# Patient Record
Sex: Male | Born: 2000 | Race: Black or African American | Hispanic: No | Marital: Single | Smoking: Never smoker
Health system: Southern US, Community
[De-identification: ages and names within clinical notes are randomized; demographics above are authoritative.]

## PROBLEM LIST (undated history)

## (undated) DIAGNOSIS — J45909 Unspecified asthma, uncomplicated: Secondary | ICD-10-CM

---

## 2021-07-21 ENCOUNTER — Ambulatory Visit (INDEPENDENT_AMBULATORY_CARE_PROVIDER_SITE_OTHER): Payer: Federal, State, Local not specified - PPO

## 2021-07-21 ENCOUNTER — Other Ambulatory Visit: Payer: Self-pay

## 2021-07-21 ENCOUNTER — Encounter (HOSPITAL_COMMUNITY): Payer: Self-pay | Admitting: *Deleted

## 2021-07-21 ENCOUNTER — Ambulatory Visit (HOSPITAL_COMMUNITY)
Admission: EM | Admit: 2021-07-21 | Discharge: 2021-07-21 | Disposition: A | Discharge status: Home / Self Care | Payer: Federal, State, Local not specified - PPO | Attending: Medical Oncology | Admitting: Medical Oncology

## 2021-07-21 DIAGNOSIS — R0602 Shortness of breath: Secondary | ICD-10-CM

## 2021-07-21 DIAGNOSIS — Z20822 Contact with and (suspected) exposure to covid-19: Secondary | ICD-10-CM | POA: Diagnosis not present

## 2021-07-21 MED ORDER — ALBUTEROL SULFATE HFA 108 (90 BASE) MCG/ACT IN AERS: 1.0000 | INHALATION_SPRAY | Freq: Four times a day (QID) | RESPIRATORY_TRACT | 0 refills | Status: AC | PRN

## 2021-07-21 NOTE — ED Triage Notes (Signed)
Pt reports he felt SHOB earlier.

## 2021-07-21 NOTE — ED Provider Notes (Signed)
[facility]    CSN: [id number] Arrival date & time: 07/21/21  1842      History   Chief Complaint SOB   HPI Jimmy Thornton is a 20 y.o. male.   HPI  SOB: Patient states that over the past few days he has noted some very quick intermittent episodes of shortness of breath.  He states that they occur at random and lasts just a few seconds and resolved.  They are not limited to exercise and do not seem to worsen with exercise.  Last episode started when he raised his left hand above his head.  He does have a history of asthma when he was a child.  He tried an old inhaler for symptoms which did help.  He denies any fevers, significant cough, wheezing, chest pain.  No known sick contacts.  History reviewed. No pertinent past medical history.  There are no problems to display for this patient.   History reviewed. No pertinent surgical history.     Home Medications    Prior to Admission medications   Medication Sig Start Date End Date Taking? Authorizing Provider  albuterol (VENTOLIN HFA) 108 (90 Base) MCG/ACT inhaler Inhale 1-2 puffs into the lungs every 6 (six) hours as needed for wheezing or shortness of breath. 07/21/21  Yes Rushie Chestnut, PA-C    Family History History reviewed. No pertinent family history.  Social History Social History   Tobacco Use   Smoking status: Never   Smokeless tobacco: Never     Allergies   Patient has no known allergies.   Review of Systems Review of Systems  As stated above in HPI Physical Exam Triage Vital Signs ED Triage Vitals  Enc Vitals Group     BP 07/21/21 1853 (!) 147/75     Pulse Rate 07/21/21 1853 79     Resp 07/21/21 1853 18     Temp 07/21/21 1853 98.7 F (37.1 C)     Temp src --      SpO2 07/21/21 1853 100 %     Weight --      Height --      Head Circumference --      Peak Flow --      Pain Score 07/21/21 1855 0     Pain Loc --      Pain Edu? --      Excl. in GC? --    No data  found.  Updated Vital Signs BP (!) 147/75   Pulse 79   Temp 98.7 F (37.1 C)   Resp 18   SpO2 100%   Physical Exam Vitals and nursing note reviewed.  Constitutional:      General: He is not in acute distress.    Appearance: Normal appearance. He is normal weight. He is not ill-appearing, toxic-appearing or diaphoretic.  HENT:     Head: Normocephalic and atraumatic.     Nose: Congestion (scant) present. No rhinorrhea.     Mouth/Throat:     Mouth: Mucous membranes are moist.     Pharynx: Oropharynx is clear. No oropharyngeal exudate or posterior oropharyngeal erythema.  Eyes:     Extraocular Movements: Extraocular movements intact.     Pupils: Pupils are equal, round, and reactive to light.     Comments: No pallor  Cardiovascular:     Rate and Rhythm: Normal rate and regular rhythm.     Pulses: Normal pulses.     Heart sounds: Normal heart sounds.  Pulmonary:  Effort: Pulmonary effort is normal. No respiratory distress.     Breath sounds: Normal breath sounds. No stridor. No wheezing, rhonchi or rales.  Chest:     Chest wall: No tenderness.  Lymphadenopathy:     Cervical: No cervical adenopathy.  Skin:    General: Skin is warm.     Coloration: Skin is not jaundiced or pale.     Comments: No evidence of cyanosis  Neurological:     Mental Status: He is alert and oriented to person, place, and time.     UC Treatments / Results  Labs (all labs ordered are listed, but only abnormal results are displayed) Labs Reviewed  SARS CORONAVIRUS 2 (TAT 6-24 HRS)    EKG   Radiology No results found.  Procedures Procedures (including critical care time)  Medications Ordered in UC Medications - No data to display  Initial Impression / Assessment and Plan / UC Course  I have reviewed the triage vital signs and the nursing notes.  Pertinent labs & imaging results that were available during my care of the patient were reviewed by me and considered in my medical decision  making (see chart for details).     New.  Vitals and physical exam are very reassuring.  COVID test pending, chest x-ray pending.  We will send in albuterol for him to use as needed.  He will need to follow-up with his PCP.  Discussed red flag signs and symptoms. Final Clinical Impressions(s) / UC Diagnoses   Final diagnoses:  SOB (shortness of breath)   Discharge Instructions   None    ED Prescriptions     Medication Sig Dispense Auth. Provider   albuterol (VENTOLIN HFA) 108 (90 Base) MCG/ACT inhaler Inhale 1-2 puffs into the lungs every 6 (six) hours as needed for wheezing or shortness of breath. 18 each Rushie Chestnut, PA-C      PDMP not reviewed this encounter.   Rushie Chestnut, Cordelia Poche 07/21/21 2029

## 2021-07-22 ENCOUNTER — Emergency Department (HOSPITAL_COMMUNITY)
Admission: EM | Admit: 2021-07-22 | Discharge: 2021-07-22 | Disposition: A | Discharge status: Home / Self Care | Payer: Federal, State, Local not specified - PPO | Attending: Emergency Medicine | Admitting: Emergency Medicine

## 2021-07-22 ENCOUNTER — Encounter (HOSPITAL_COMMUNITY): Payer: Self-pay | Admitting: Emergency Medicine

## 2021-07-22 ENCOUNTER — Emergency Department (HOSPITAL_COMMUNITY): Payer: Federal, State, Local not specified - PPO

## 2021-07-22 DIAGNOSIS — R55 Syncope and collapse: Secondary | ICD-10-CM | POA: Diagnosis not present

## 2021-07-22 DIAGNOSIS — R42 Dizziness and giddiness: Secondary | ICD-10-CM | POA: Diagnosis not present

## 2021-07-22 DIAGNOSIS — R0602 Shortness of breath: Secondary | ICD-10-CM | POA: Diagnosis not present

## 2021-07-22 DIAGNOSIS — J45909 Unspecified asthma, uncomplicated: Secondary | ICD-10-CM | POA: Diagnosis not present

## 2021-07-22 HISTORY — DX: Unspecified asthma, uncomplicated: J45.909

## 2021-07-22 LAB — SARS CORONAVIRUS 2 (TAT 6-24 HRS): SARS Coronavirus 2: NEGATIVE

## 2021-07-22 LAB — BASIC METABOLIC PANEL
Anion gap: 7 (ref 5–15)
BUN: 16 mg/dL (ref 6–20)
CO2: 28 mmol/L (ref 22–32)
Calcium: 9.3 mg/dL (ref 8.9–10.3)
Chloride: 101 mmol/L (ref 98–111)
Creatinine, Ser: 1.02 mg/dL (ref 0.61–1.24)
GFR, Estimated: >60 mL/min (ref >60)
Glucose, Bld: 85 mg/dL (ref 70–99)
Potassium: 3.9 mmol/L (ref 3.5–5.1)
Sodium: 136 mmol/L (ref 135–145)

## 2021-07-22 LAB — CBC
HCT: 42.5 % (ref 39.0–52.0)
Hemoglobin: 14.4 g/dL (ref 13.0–17.0)
MCH: 31.3 pg (ref 26.0–34.0)
MCHC: 33.9 g/dL (ref 30.0–36.0)
MCV: 92.4 fL (ref 80.0–100.0)
Platelets: 267 10*3/uL (ref 150–400)
RBC: 4.6 MIL/uL (ref 4.22–5.81)
RDW: 11 % — ABNORMAL LOW (ref 11.5–15.5)
WBC: 5.7 10*3/uL (ref 4.0–10.5)
nRBC: 0 % (ref 0.0–0.2)

## 2021-07-22 LAB — TROPONIN I (HIGH SENSITIVITY)
Troponin I (High Sensitivity): <2 ng/L (ref <18)
Troponin I (High Sensitivity): 3 ng/L (ref <18)

## 2021-07-22 LAB — D-DIMER, QUANTITATIVE: D-Dimer, Quant: <0.27 ug/mL-FEU (ref 0.00–0.50)

## 2021-07-22 MED ORDER — SODIUM CHLORIDE 0.9 % IV BOLUS
1000.0000 mL | Freq: Once | INTRAVENOUS | Status: AC
Start: 2021-07-22 — End: 2021-07-22
  Administered 2021-07-22: 1000 mL via INTRAVENOUS

## 2021-07-22 NOTE — ED Triage Notes (Signed)
Pt reports syncopal episode while watching his brother at [employer].  States someone caught him.  Denies dizziness piror to or after event.  States he was taking Vitamin D3 but stopped approx 1 week ago because of the way he made him feel.

## 2021-07-22 NOTE — ED Provider Notes (Signed)
[facility] EMERGENCY DEPARTMENT Provider Note   CSN: [id number] Arrival date & time: 07/22/21  1435     History No chief complaint on file.   Alias Jimmy Thornton is a 20 y.o. male with a past medical history significant for asthma who presents to the ED after a syncopal episode.  Patient states he was watching his brother play at the [place] when he had a syncopal episode.  Patient notes episode was witnessed.  No convulsions.  Denies history of seizures.  No urinary incontinence or mouth trauma.  Patient states he was caught.  No head injury.  Patient is unsure how long he was unconscious for.  Patient states he "knew he was going to pass out" prior to the syncopal episode.  He states he was having some shortness of breath and lightheadedness.  Patient notes he has been having intermittent episodes of shortness of breath for the past few days.  Patient was evaluated at urgent care yesterday with reassuring work-up.  Patient has a history of asthma.  Denies wheeze, cough, fever/chills.  Denies associated chest pain.  Denies history of blood clots, recent surgeries or recent long immobilizations, hormonal treatments.  Patient was evaluated by the paramedics after syncopal episode who believed it was due to dehydration. No previous syncopal episodes. No recent illness. Normal oral intake over the past few days.   History obtained from patient and past medical records. No interpreter used during encounter.       Past Medical History:  Diagnosis Date   Asthma     There are no problems to display for this patient.   History reviewed. No pertinent surgical history.     No family history on file.  Social History   Tobacco Use   Smoking status: Never   Smokeless tobacco: Never  Substance Use Topics   Alcohol use: Not Currently   Drug use: Not Currently    Home Medications Prior to Admission medications   Medication Sig Start Date End Date Taking?  Authorizing Provider  albuterol (VENTOLIN HFA) 108 (90 Base) MCG/ACT inhaler Inhale 1-2 puffs into the lungs every 6 (six) hours as needed for wheezing or shortness of breath. 07/21/21  Yes Covington, Sarah M, PA-C  Multiple Vitamin (MULTIVITAMIN WITH MINERALS) TABS tablet Take 1 tablet by mouth daily.   Yes [provider]  Vitamin D, Cholecalciferol, 10 MCG (400 UNIT) CAPS Take 400 Units by mouth daily.    [provider]    Allergies    Patient has no known allergies.  Review of Systems   Review of Systems  Constitutional:  Negative for chills and fever.  Respiratory:  Positive for shortness of breath. Negative for cough and wheezing.   Cardiovascular:  Negative for chest pain and leg swelling.  Gastrointestinal:  Negative for abdominal pain, nausea and vomiting.  Neurological:  Positive for syncope and light-headedness. Negative for dizziness and headaches.  All other systems reviewed and are negative.  Physical Exam Updated Vital Signs BP 138/84 (BP Location: Right Arm)   Pulse 88   Temp 98.6 F (37 C) (Oral)   Resp 18   SpO2 100%   Physical Exam Vitals and nursing note reviewed.  Constitutional:      General: He is not in acute distress.    Appearance: He is not ill-appearing.  HENT:     Head: Normocephalic.  Eyes:     Pupils: Pupils are equal, round, and reactive to light.  Cardiovascular:  Rate and Rhythm: Normal rate and regular rhythm.     Pulses: Normal pulses.     Heart sounds: Normal heart sounds. No murmur heard.   No friction rub. No gallop.  Pulmonary:     Effort: Pulmonary effort is normal.     Breath sounds: Normal breath sounds.  Abdominal:     General: Abdomen is flat. There is no distension.     Palpations: Abdomen is soft.     Tenderness: There is no abdominal tenderness. There is no guarding or rebound.  Musculoskeletal:        General: Normal range of motion.     Cervical back: Neck supple.  Skin:    General: Skin is warm  and dry.  Neurological:     General: No focal deficit present.     Mental Status: He is alert.     Comments: Speech is clear, able to follow commands CN III-XII intact Normal strength in upper and lower extremities bilaterally including dorsiflexion and plantar flexion, strong and equal grip strength Sensation grossly intact throughout Moves extremities without ataxia, coordination intact No pronator drift Ambulates without difficulty  Psychiatric:        Mood and Affect: Mood normal.        Behavior: Behavior normal.    ED Results / Procedures / Treatments   Labs (all labs ordered are listed, but only abnormal results are displayed) Labs Reviewed  CBC - Abnormal; Notable for the following components:      Result Value   RDW 11.0 (*)    All other components within normal limits  BASIC METABOLIC PANEL  D-DIMER, QUANTITATIVE  TROPONIN I (HIGH SENSITIVITY)  TROPONIN I (HIGH SENSITIVITY)    EKG EKG Interpretation  Date/Time:  Saturday July 22 2021 15:08:55 EDT Ventricular Rate:  73 PR Interval:  132 QRS Duration: 92 QT Interval:  350 QTC Calculation: 385 R Axis:   70 Text Interpretation: Normal sinus rhythm ST elevation, consider early repolarization No old tracing to compare Confirmed by Pricilla Loveless [phone]) on 07/22/2021 5:18:08 PM  Radiology DG Chest 2 View  Result Date: 07/22/2021 CLINICAL DATA:  Shortness of breath, syncope EXAM: CHEST - 2 VIEW COMPARISON:  07/21/2021 FINDINGS: The heart size and mediastinal contours are within normal limits. Both lungs are clear. The visualized skeletal structures are unremarkable. IMPRESSION: No active cardiopulmonary disease. Electronically Signed   By: Charlett Nose M.D.   On: 07/22/2021 18:23   DG Chest 2 View  Result Date: 07/21/2021 CLINICAL DATA:  Shortness of breath, history of asthma EXAM: CHEST - 2 VIEW COMPARISON:  None. FINDINGS: No consolidation, features of edema, pneumothorax, or effusion. Pulmonary vascularity is  normally distributed. The cardiomediastinal contours are unremarkable. No acute osseous or soft tissue abnormality. IMPRESSION: No acute cardiopulmonary abnormality. Electronically Signed   By: Kreg Shropshire M.D.   On: 07/21/2021 19:42    Procedures Procedures   Medications Ordered in ED Medications  sodium chloride 0.9 % bolus 1,000 mL (1,000 mLs Intravenous New Bag/Given 07/22/21 1847)    ED Course  I have reviewed the triage vital signs and the nursing notes.  Pertinent labs & imaging results that were available during my care of the patient were reviewed by me and considered in my medical decision making (see chart for details).    MDM Rules/Calculators/A&P                          20 year old male presents to the  ED after a syncopal episode that occurred while watching the [place].  Syncopal episode was witnessed.  Patient was told there were no convulsions.  No history of seizures.  No urinary incontinence or mouth trauma.  No previous syncopal episodes.  Patient has been complaining of intermittent shortness of breath for the past few days which he describes as "labored breathing".  No history of blood clots, recent surgeries or recent long immobilizations, and hormonal treatments.  No lower extremity edema.  Upon arrival, stable vitals.  Patient is afebrile, not tachycardic or hypoxic.  Patient in no acute distress.  Physical exam reassuring.  Normal neurological exam.  No mouth trauma.  Lungs clear to auscultation bilaterally without wheeze.  Low suspicion for asthma exacerbation.  Labs ordered at triage. IVFs given. D-dimer added to rule out PE given subjective SOB with a syncope episode. Troponin ordered. Discussed case with Dr. Criss Alvine who agrees with assessment and plan.   CBC reassuring with no leukocytosis and normal hemoglobin.  BMP unremarkable with normal renal function no major electrolyte derangements.  EKG demonstrates normal sinus rhythm with probable early  repolarization.  D-dimer normal.  Low suspicion for PE. Troponin normal. Low suspicion for cardiac etiology. Orthostatic vitals within normal limits. CXR personally reviewed which is negative for signs of pneumonia, pneumothorax or widened mediastinum. Syncope of unknown etiology. Advised patient to follow-up with PCP early next week for further evaluation. Strict ED precautions discussed with patient. Patient states understanding and agrees to plan. Patient discharged home in no acute distress and stable vitals.  Final Clinical Impression(s) / ED Diagnoses Final diagnoses:  Syncope, unspecified syncope type    Rx / DC Orders ED Discharge Orders     None        Jesusita Oka 07/22/21 2057    Pricilla Loveless, MD 07/22/21 2149

## 2021-07-22 NOTE — Discharge Instructions (Addendum)
It was a pleasure taking care of you today. As discussed, all of your labs, chest x-ray and EKG were normal. Please follow-up with PCP within the next week for further evaluation. Return to the ER for new or worsening symptoms.

## 2021-07-22 NOTE — ED Provider Notes (Signed)
Emergency Medicine Provider Triage Evaluation Note  Jimmy Thornton , a 20 y.o. male  was evaluated in triage.  Pt complains of syncopal episode earlier today while watching his brother played the junior [employer].  Patient states that he was standing and watching his brother, and passed out.  Was unconscious for several minutes.  He states the paramedic evaluated him and thinks that he may have been dehydrated.  He however presents to the ER for further evaluation.  Denies any chest pain, states he has asthma and has been feeling some shortness of breath over the last several days.  Review of Systems  Positive: As above  Negative: As above   Physical Exam  BP (!) 144/93 (BP Location: Left Arm)   Pulse 87   Temp 98.5 F (36.9 C)   Resp 17   SpO2 98%  Gen:   Awake, no distress   Resp:  Normal effort  MSK:   Moves extremities without difficulty  Other:    Medical Decision Making  Medically screening exam initiated at 3:03 PM.  Appropriate orders placed.  Jimmy Thornton was informed that the remainder of the evaluation will be completed by another provider, this initial triage assessment does not replace that evaluation, and the importance of remaining in the ED until their evaluation is complete.     Mare Ferrari, PA-C 07/22/21 1505    Ernie Avena, MD 07/22/21 2146

## 2022-08-04 IMAGING — DX DG CHEST 2V
2 series · 2 of 2 positions shown · non-contrast
Comparison: None.

CLINICAL DATA: Shortness of breath, history of asthma

EXAM:
CHEST - 2 VIEW

[chest pa]
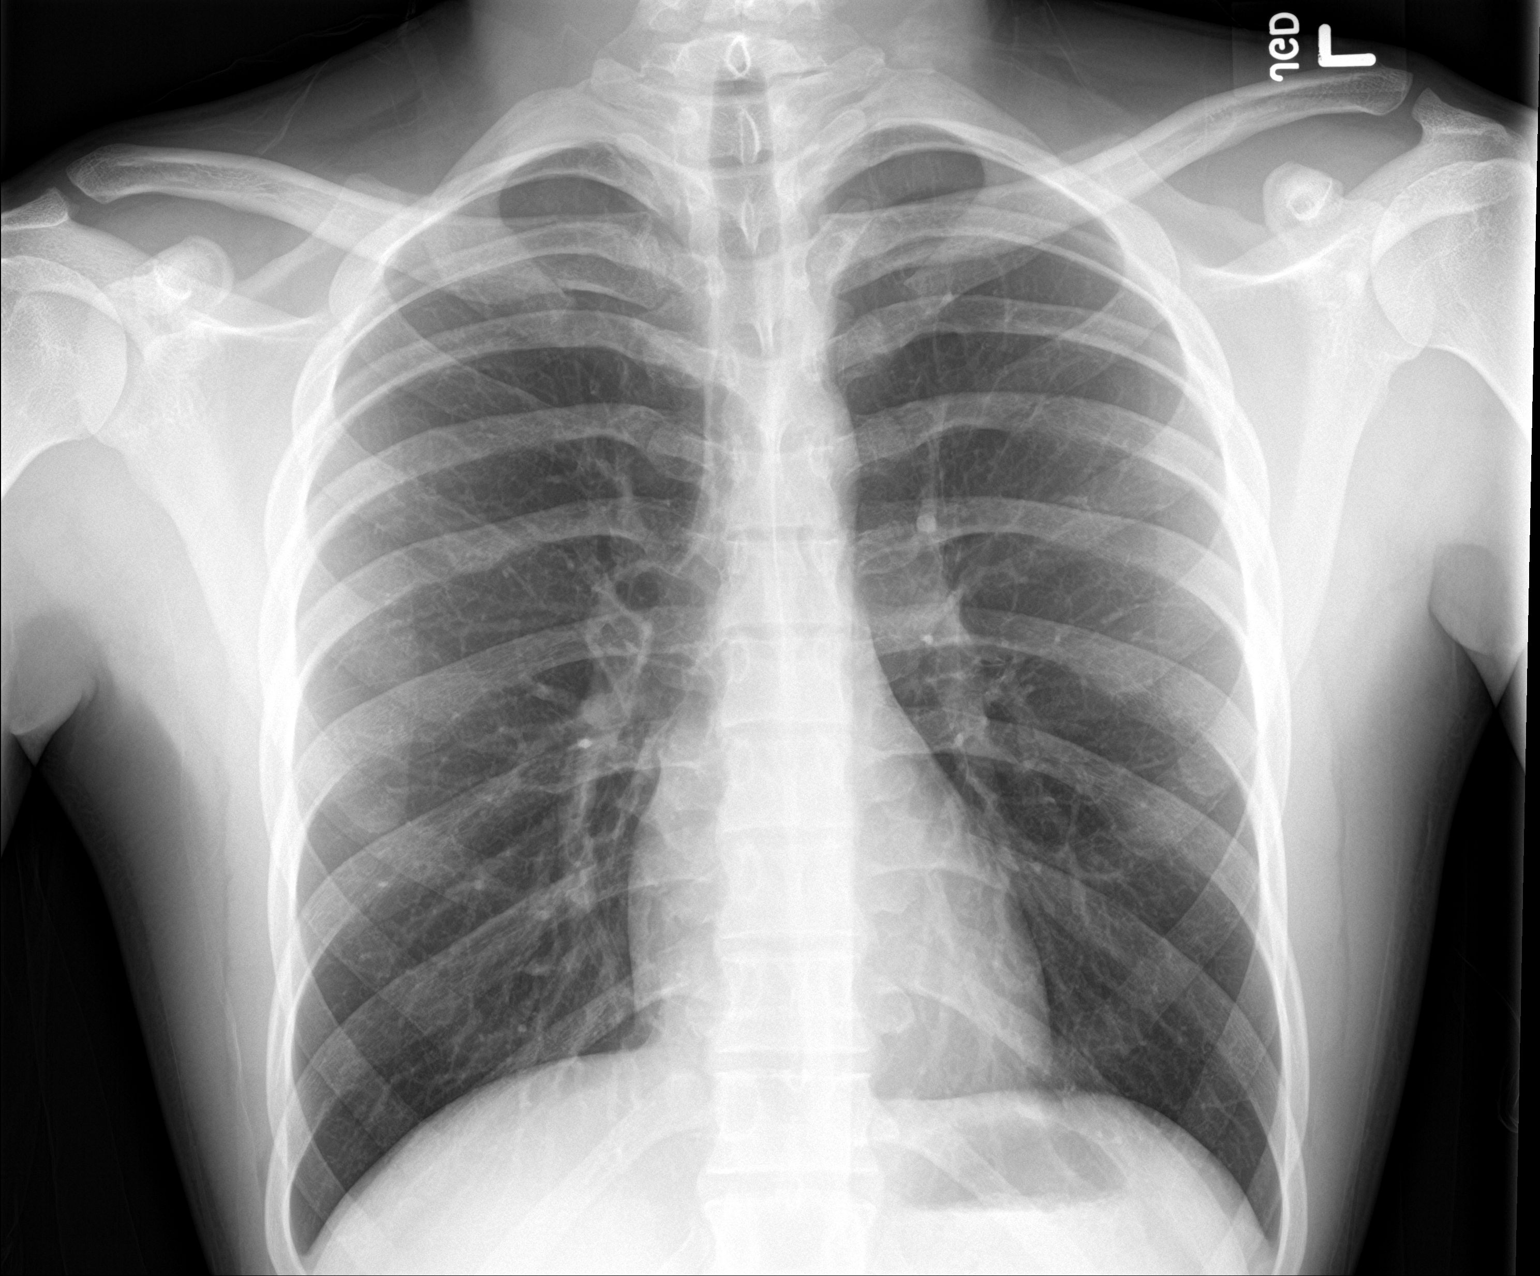

[chest lat]
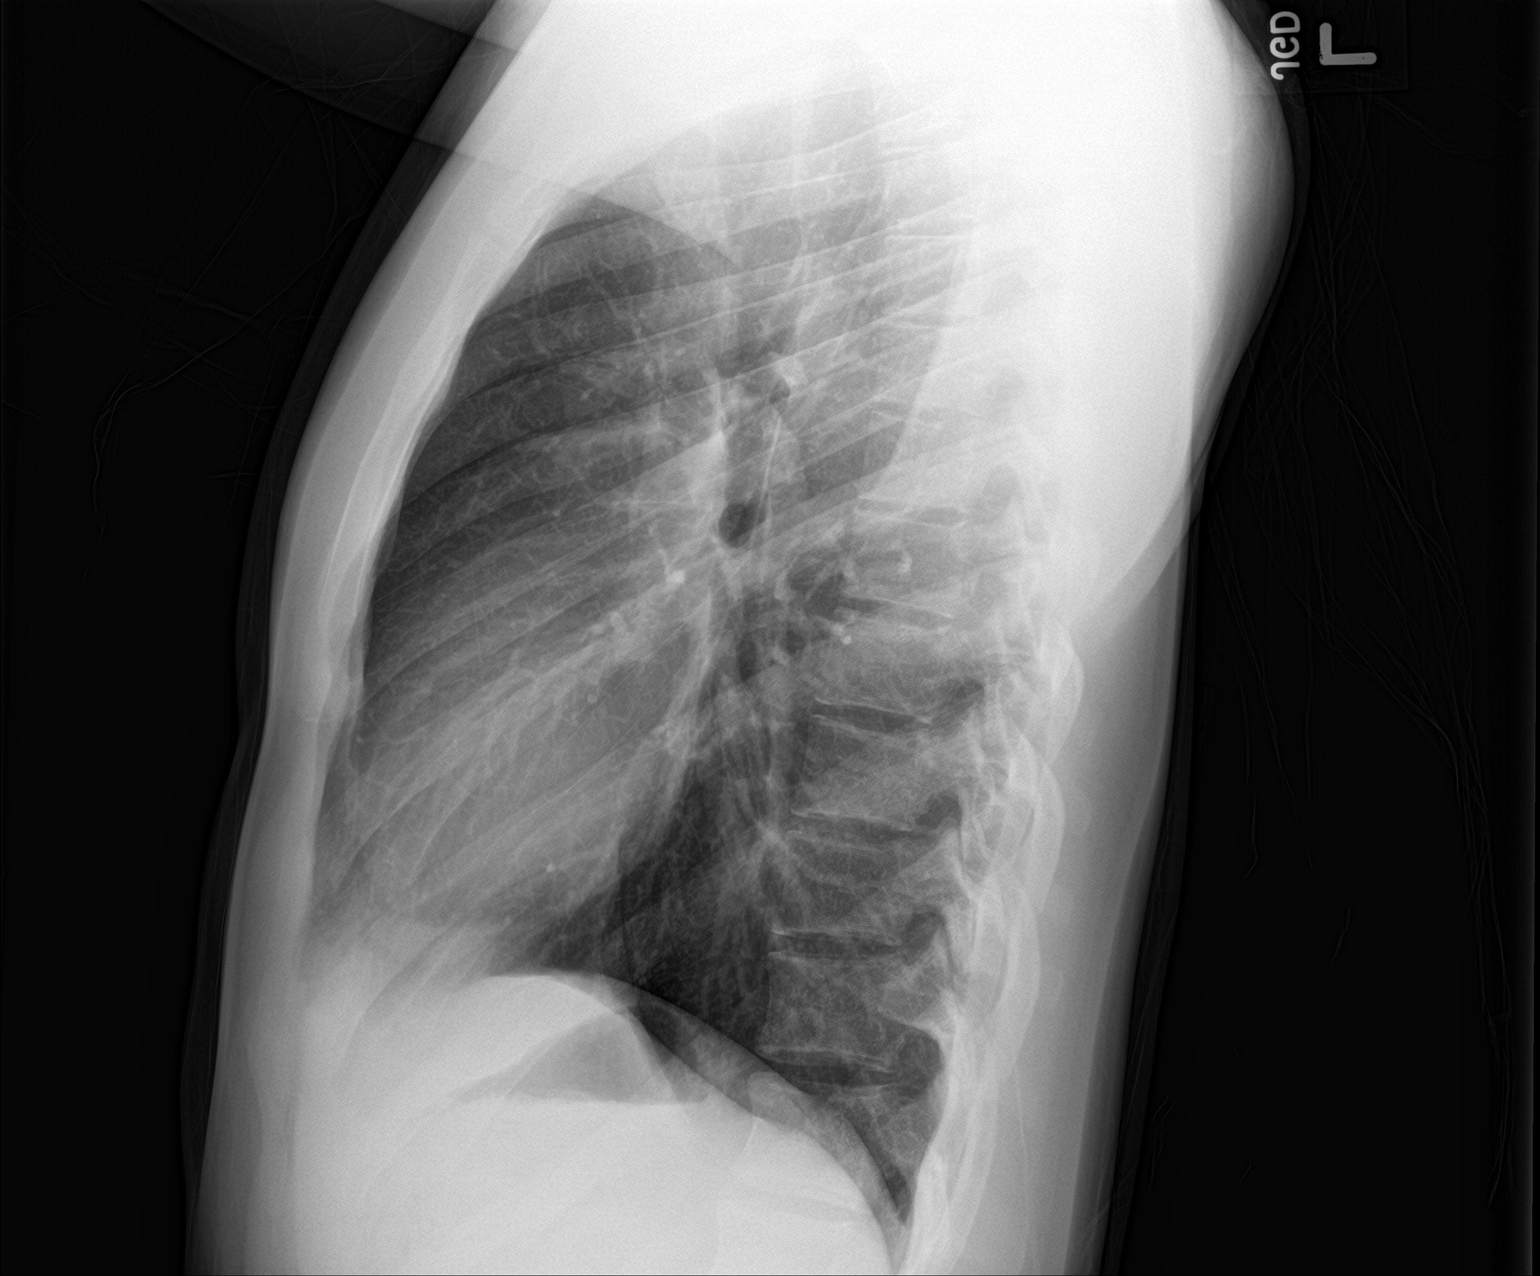

[2 of 2 positions shown; findings below may reference images not displayed]

FINDINGS: No consolidation, features of edema, pneumothorax, or effusion.
Pulmonary vascularity is normally distributed. The cardiomediastinal
contours are unremarkable. No acute osseous or soft tissue
abnormality.
IMPRESSION: No acute cardiopulmonary abnormality.

## 2022-08-05 IMAGING — DX DG CHEST 2V
2 series · 2 of 2 positions shown · non-contrast
Comparison: 07/21/2021

CLINICAL DATA: Shortness of breath, syncope

EXAM:
CHEST - 2 VIEW

[chest lat]
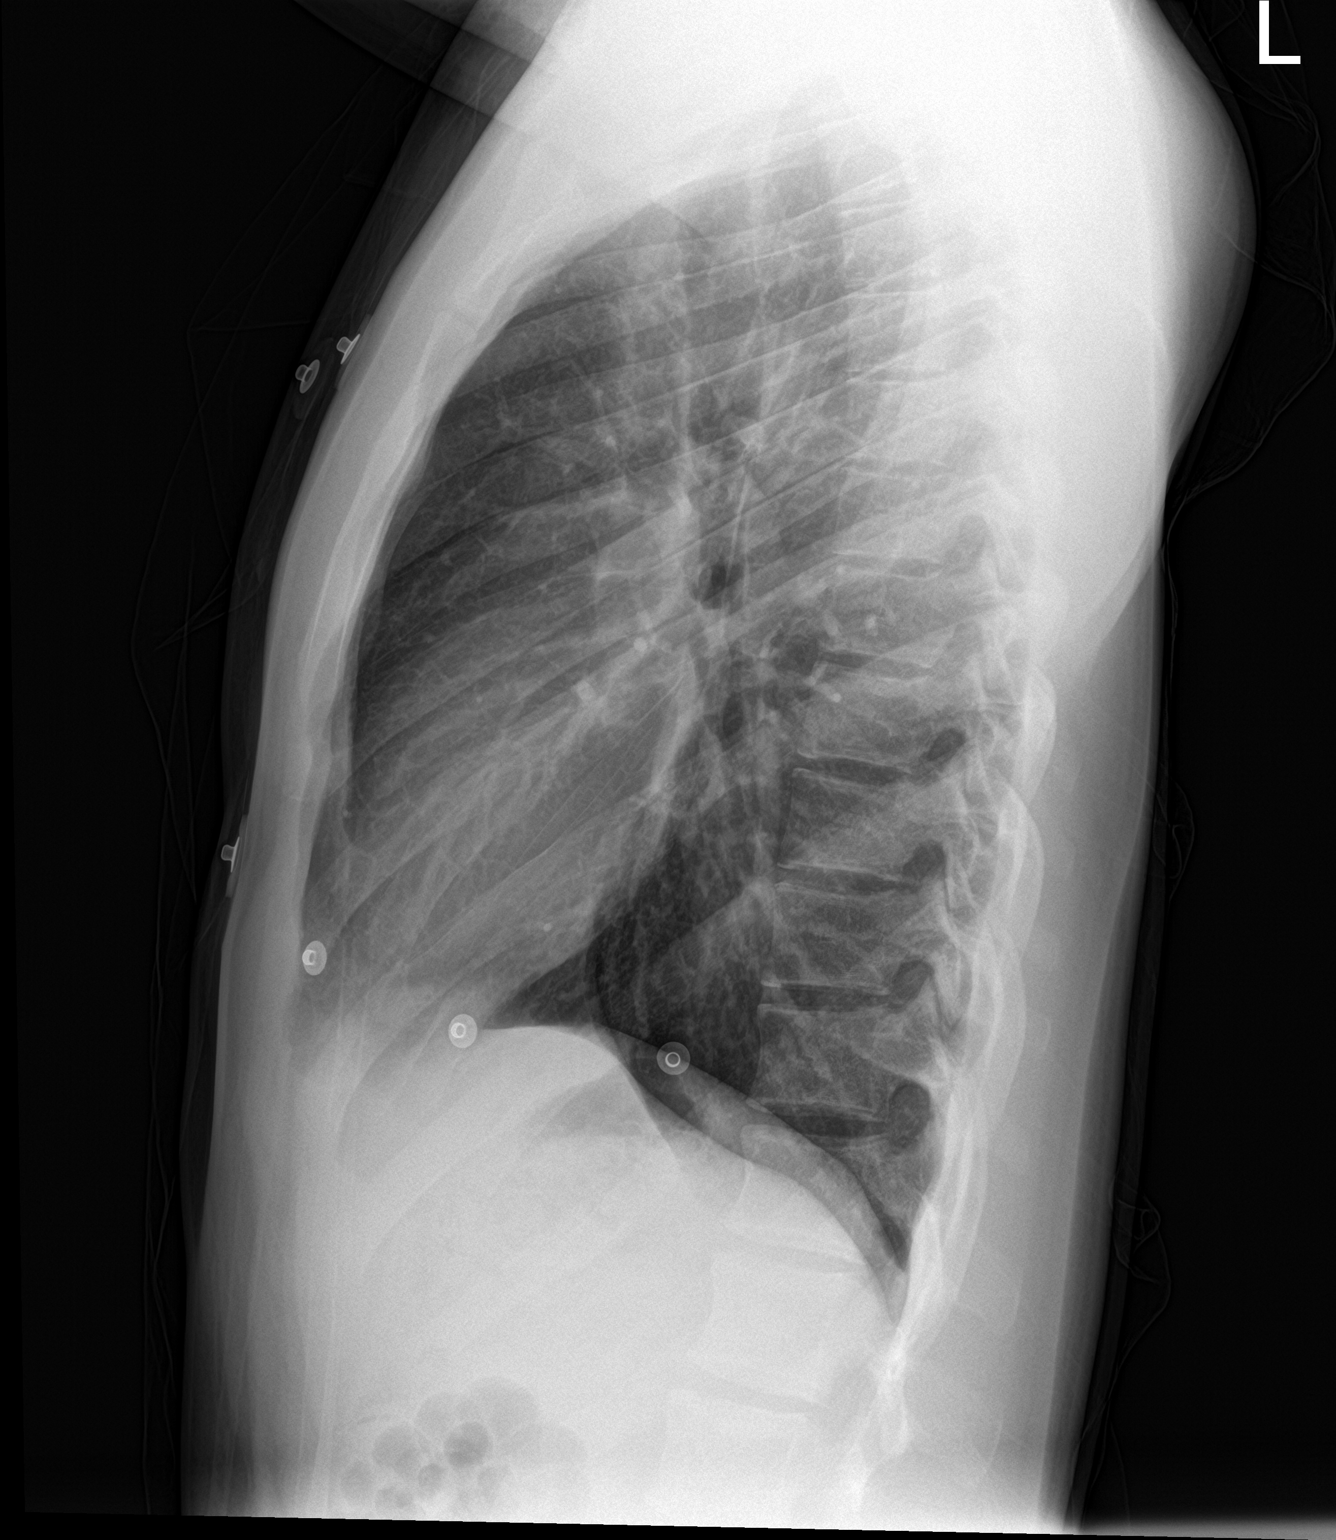

[chest ap]
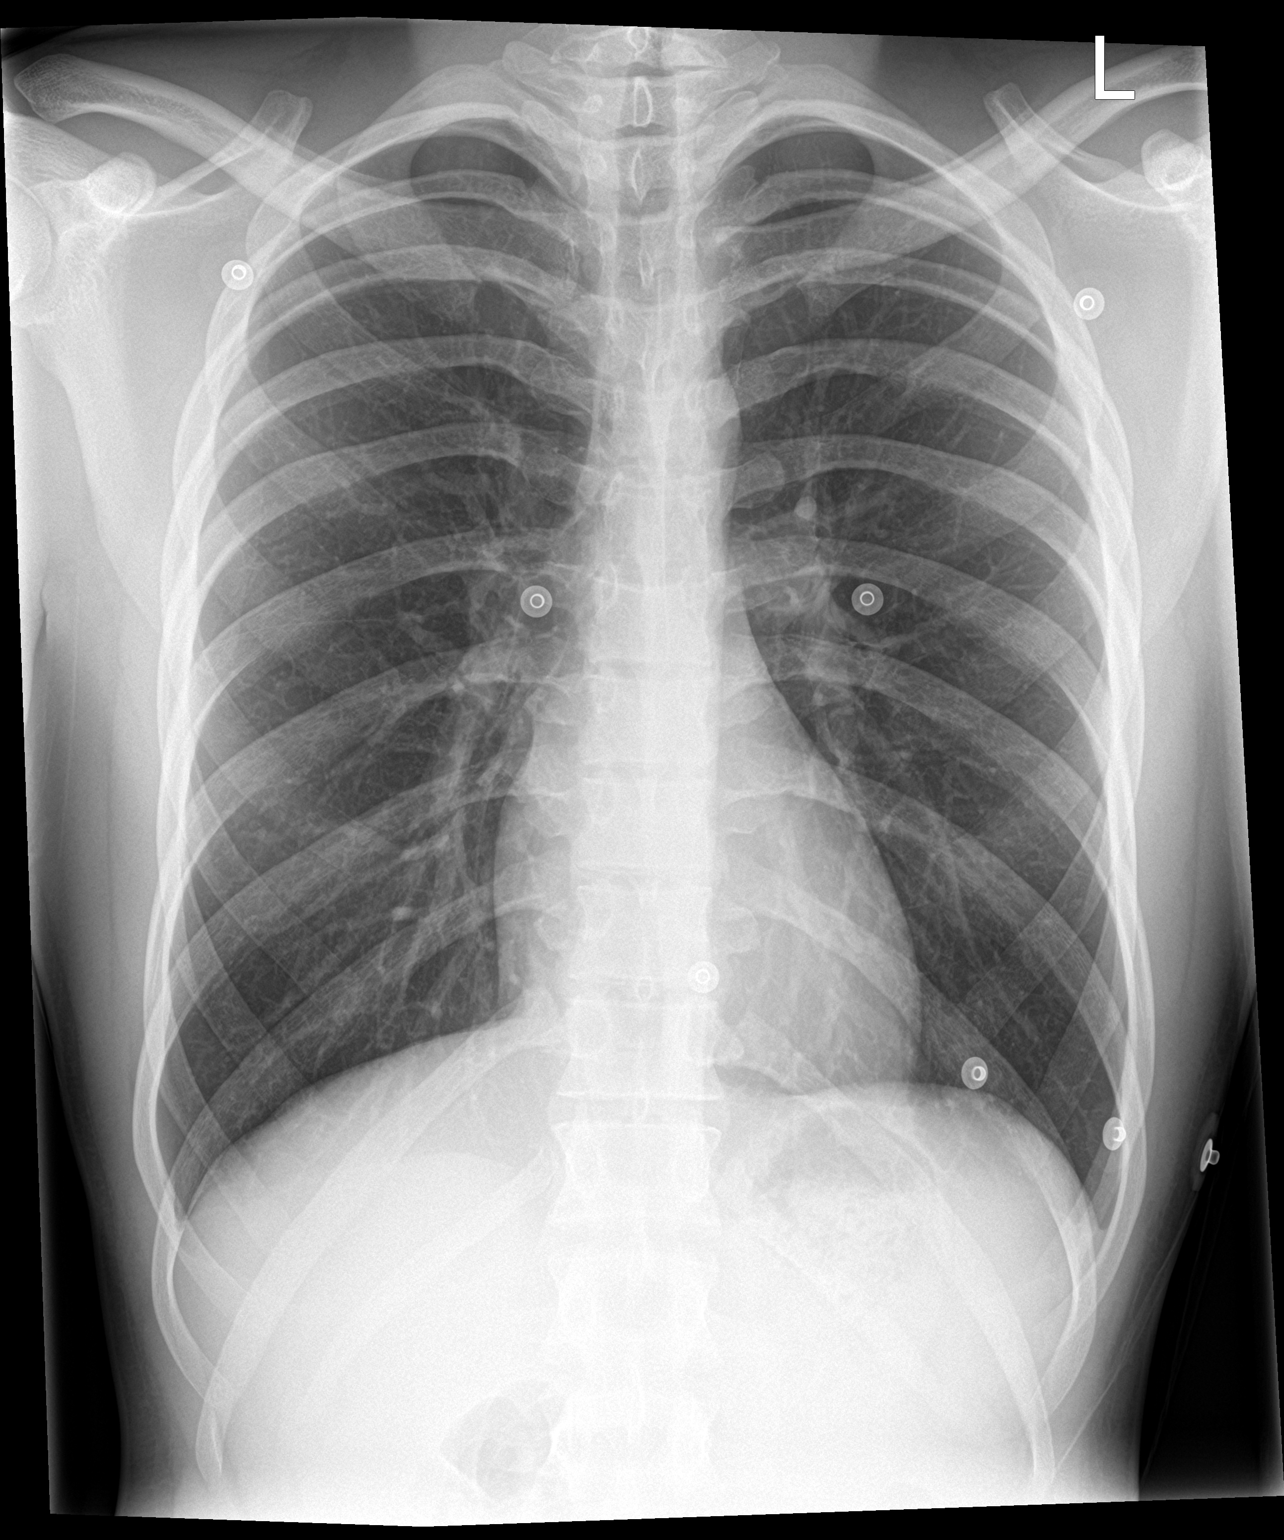

[2 of 2 positions shown; findings below may reference images not displayed]

FINDINGS: The heart size and mediastinal contours are within normal limits.
Both lungs are clear. The visualized skeletal structures are
unremarkable.
IMPRESSION: No active cardiopulmonary disease.
